# Patient Record
Sex: Female | Born: 1980
Health system: Southern US, Community
[De-identification: ages and names within clinical notes are randomized; demographics above are authoritative.]

## PROBLEM LIST (undated history)

## (undated) ENCOUNTER — Inpatient Hospital Stay (HOSPITAL_COMMUNITY): Payer: Self-pay

## (undated) DIAGNOSIS — Z789 Other specified health status: Secondary | ICD-10-CM

## (undated) HISTORY — PX: NO PAST SURGERIES: SHX2092

---

## 2001-09-29 ENCOUNTER — Inpatient Hospital Stay (HOSPITAL_COMMUNITY): Admission: AD | Admit: 2001-09-29 | Discharge: 2001-09-29 | Payer: Self-pay | Admitting: Obstetrics and Gynecology

## 2001-10-02 ENCOUNTER — Inpatient Hospital Stay (HOSPITAL_COMMUNITY): Admission: AD | Admit: 2001-10-02 | Discharge: 2001-10-02 | Payer: Self-pay | Admitting: Obstetrics and Gynecology

## 2002-10-15 ENCOUNTER — Emergency Department (HOSPITAL_COMMUNITY): Admission: EM | Admit: 2002-10-15 | Discharge: 2002-10-15 | Payer: Self-pay | Admitting: Emergency Medicine

## 2003-01-15 ENCOUNTER — Inpatient Hospital Stay (HOSPITAL_COMMUNITY): Admission: AD | Admit: 2003-01-15 | Discharge: 2003-01-18 | Payer: Self-pay | Admitting: Obstetrics and Gynecology

## 2003-11-05 ENCOUNTER — Inpatient Hospital Stay (HOSPITAL_COMMUNITY): Admission: AD | Admit: 2003-11-05 | Discharge: 2003-11-05 | Payer: Self-pay | Admitting: Obstetrics and Gynecology

## 2004-01-05 ENCOUNTER — Emergency Department (HOSPITAL_COMMUNITY): Admission: EM | Admit: 2004-01-05 | Discharge: 2004-01-05 | Payer: Self-pay | Admitting: Emergency Medicine

## 2004-11-02 ENCOUNTER — Inpatient Hospital Stay (HOSPITAL_COMMUNITY): Admission: AD | Admit: 2004-11-02 | Discharge: 2004-11-02 | Payer: Self-pay | Admitting: Obstetrics and Gynecology

## 2005-01-01 ENCOUNTER — Inpatient Hospital Stay (HOSPITAL_COMMUNITY): Admission: AD | Admit: 2005-01-01 | Discharge: 2005-01-01 | Payer: Self-pay | Admitting: Obstetrics and Gynecology

## 2005-11-03 ENCOUNTER — Emergency Department (HOSPITAL_COMMUNITY): Admission: EM | Admit: 2005-11-03 | Discharge: 2005-11-03 | Payer: Self-pay | Admitting: Emergency Medicine

## 2006-02-08 ENCOUNTER — Inpatient Hospital Stay (HOSPITAL_COMMUNITY): Admission: AD | Admit: 2006-02-08 | Discharge: 2006-02-08 | Payer: Self-pay | Admitting: Obstetrics & Gynecology

## 2006-12-05 ENCOUNTER — Emergency Department (HOSPITAL_COMMUNITY): Admission: EM | Admit: 2006-12-05 | Discharge: 2006-12-05 | Payer: Self-pay | Admitting: Family Medicine

## 2007-07-14 ENCOUNTER — Emergency Department (HOSPITAL_COMMUNITY): Admission: EM | Admit: 2007-07-14 | Discharge: 2007-07-14 | Payer: Self-pay | Admitting: Family Medicine

## 2008-02-26 ENCOUNTER — Emergency Department (HOSPITAL_COMMUNITY): Admission: EM | Admit: 2008-02-26 | Discharge: 2008-02-26 | Payer: Self-pay | Admitting: Family Medicine

## 2009-02-03 ENCOUNTER — Emergency Department (HOSPITAL_COMMUNITY): Admission: EM | Admit: 2009-02-03 | Discharge: 2009-02-03 | Payer: Self-pay | Admitting: Emergency Medicine

## 2009-08-12 ENCOUNTER — Emergency Department (HOSPITAL_COMMUNITY): Admission: EM | Admit: 2009-08-12 | Discharge: 2009-08-12 | Payer: Self-pay | Admitting: Family Medicine

## 2010-05-30 ENCOUNTER — Emergency Department (HOSPITAL_COMMUNITY)
Admission: EM | Admit: 2010-05-30 | Discharge: 2010-05-30 | Payer: Self-pay | Source: Home / Self Care | Admitting: Emergency Medicine

## 2010-08-29 LAB — PREGNANCY, URINE: Preg Test, Ur: NEGATIVE

## 2010-08-29 LAB — URINALYSIS, ROUTINE W REFLEX MICROSCOPIC
Bilirubin Urine: NEGATIVE
Glucose, UA: NEGATIVE mg/dL
Ketones, ur: NEGATIVE mg/dL
Nitrite: NEGATIVE
Urobilinogen, UA: 0.2 mg/dL (ref 0.0–1.0)
pH: 6 (ref 5.0–8.0)

## 2010-09-23 LAB — WET PREP, GENITAL: Clue Cells Wet Prep HPF POC: NONE SEEN

## 2010-09-23 LAB — GC/CHLAMYDIA PROBE AMP, GENITAL: Chlamydia, DNA Probe: NEGATIVE

## 2010-11-03 NOTE — Discharge Summary (Signed)
NAME:  Kathleen Andersen, Kathleen Andersen                        ACCOUNT NO.:  [id number]   MEDICAL RECORD NO.:  [id number]                   PATIENT TYPE:  INP   LOCATION:  9115                                 FACILITY:  [facility]   PHYSICIAN:  Malachi Pro. Ambrose Mantle, M.D.              DATE OF BIRTH:  1980/09/04   DATE OF ADMISSION:  01/15/2003  DATE OF DISCHARGE:  01/18/2003                                 DISCHARGE SUMMARY   HISTORY OF PRESENT ILLNESS:  A 30 year old black single female para 0-0-1-0,  gravida 2, last period April 03, 2002, [facility] January 09, 2003 by dates, but  January 24, 2003 by ultrasound admitted for induction.  Blood group and type  O+.  Negative antibody.  Nonreactive serology.  Rubella immune.  Hepatitis B  surface antigen negative.  HIV negative.  GC and Chlamydia negative.  One-  hour Glucola 105.  Group B Strep negative.  Urine culture E. coli Proteus  mirabilis.  Ultrasound July 08, 2002:  Average gestational age 53.3  weeks, [facility] January 24, 2003.  On August 31, 2002 ultrasound:  Average  gestational age [redacted] weeks 5 days, [facility] January 23, 2003.  Prenatal course was  uncomplicated.  On the day of admission the cervix was 2 cm, 60%.  She was  offered induction.   ALLERGIES:  No known allergies.   PAST SURGICAL HISTORY:  None.   PAST MEDICAL HISTORY:  Usual childhood diseases.  She did have Chlamydia in  October of 2003.   SOCIAL HISTORY:  Alcohol, tobacco, and drugs:  None.   FAMILY HISTORY:  No significant family history in first degree relatives.   PAST OBSTETRICAL HISTORY:  Termination of pregnancy in March of 2003 at 11  weeks.   PHYSICAL EXAMINATION:  VITAL SIGNS:  Normal.  ABDOMEN:  Soft.  Fundal height 38 cm.  Fetal heart tones normal.  Cervix 2  cm, 60%.   HOSPITAL COURSE:  Artificial rupture of the membranes was attempted and was  successful.  The patient entered active labor, received an epidural, and was  progressed to full dilatation at 6:27 a.m.  She pushed well,  brought the  vertex to the perineum, and delivered spontaneously ROA over a bilateral  labial and a right vaginal laceration a living female infant 5 pounds 11  ounces with Apgars of 8 at one and 9 at five minutes.  Malachi Pro. Ambrose Mantle,  M.D. was in attendance.  Placenta intact.  Uterus normal.  Right vaginal  laceration sutured with 2-0 Vicryl for hemostasis.  All other lacerations  were superficial, not bleeding, and were not repaired.  Blood loss about 300  mL.  Postpartum the patient did well and was discharged on the second  postpartum day.   LABORATORY DATA:  Initial hemoglobin 11.2, hematocrit 33.3, white count  8400, platelet count 279,000.  Follow-up hemoglobin 9.6.  RPR nonreactive.   FINAL DIAGNOSES:  Intrauterine pregnancy at 46  weeks, delivered right  occiput anterior.   OPERATION:  1. Spontaneous delivery right occiput anterior.  2. Repair of right vaginal laceration.   FINAL CONDITION:  Improved.   DISCHARGE INSTRUCTIONS:  Regular discharge instruction booklet.  The patient  is given a prescription for ibuprofen 600 mg 20 tablets one q.6h. as needed  for pain and one refill.  She is asked to return to the office in six weeks  for follow-up examination.                                               Malachi Pro. Ambrose Mantle, M.D.    TFH/MEDQ  D:  01/18/2003  T:  01/18/2003  Job:  [id number]

## 2010-12-20 ENCOUNTER — Inpatient Hospital Stay (INDEPENDENT_AMBULATORY_CARE_PROVIDER_SITE_OTHER)
Admission: RE | Admit: 2010-12-20 | Discharge: 2010-12-20 | Disposition: A | Discharge status: Home / Self Care | Payer: Self-pay | Source: Ambulatory Visit | Attending: Emergency Medicine | Admitting: Emergency Medicine

## 2010-12-20 DIAGNOSIS — Z331 Pregnant state, incidental: Secondary | ICD-10-CM

## 2010-12-20 DIAGNOSIS — R6889 Other general symptoms and signs: Secondary | ICD-10-CM

## 2010-12-20 LAB — POCT URINALYSIS DIP (DEVICE)
Glucose, UA: NEGATIVE mg/dL
Hgb urine dipstick: NEGATIVE
Ketones, ur: NEGATIVE mg/dL
Nitrite: NEGATIVE
Protein, ur: NEGATIVE mg/dL
Specific Gravity, Urine: >=1.03 (ref 1.005–1.030)
Urobilinogen, UA: 0.2 mg/dL (ref 0.0–1.0)

## 2010-12-20 LAB — POCT PREGNANCY, URINE: Preg Test, Ur: POSITIVE

## 2011-02-12 ENCOUNTER — Inpatient Hospital Stay (INDEPENDENT_AMBULATORY_CARE_PROVIDER_SITE_OTHER)
Admission: RE | Admit: 2011-02-12 | Discharge: 2011-02-12 | Disposition: A | Discharge status: Home / Self Care | Payer: Self-pay | Source: Ambulatory Visit | Attending: Family Medicine | Admitting: Family Medicine

## 2011-02-12 DIAGNOSIS — K649 Unspecified hemorrhoids: Secondary | ICD-10-CM

## 2011-04-04 LAB — POCT PREGNANCY, URINE
Operator id: 239701
Preg Test, Ur: NEGATIVE

## 2012-12-27 ENCOUNTER — Encounter (HOSPITAL_COMMUNITY): Payer: Self-pay | Admitting: *Deleted

## 2012-12-27 ENCOUNTER — Inpatient Hospital Stay (HOSPITAL_COMMUNITY): Payer: Self-pay

## 2012-12-27 ENCOUNTER — Inpatient Hospital Stay (HOSPITAL_COMMUNITY)
Admission: AD | Admit: 2012-12-27 | Discharge: 2012-12-27 | Disposition: A | Discharge status: Home / Self Care | Payer: Self-pay | Source: Ambulatory Visit | Attending: Family Medicine | Admitting: Family Medicine

## 2012-12-27 DIAGNOSIS — O209 Hemorrhage in early pregnancy, unspecified: Secondary | ICD-10-CM | POA: Insufficient documentation

## 2012-12-27 HISTORY — DX: Other specified health status: Z78.9

## 2012-12-27 LAB — CBC
MCH: 26.7 pg (ref 26.0–34.0)
MCHC: 33.3 g/dL (ref 30.0–36.0)
RBC: 4.46 MIL/uL (ref 3.87–5.11)

## 2012-12-27 LAB — WET PREP, GENITAL
WBC, Wet Prep HPF POC: NONE SEEN
Yeast Wet Prep HPF POC: NONE SEEN

## 2012-12-27 LAB — HCG, QUANTITATIVE, PREGNANCY: hCG, Beta Chain, Quant, S: 12139 m[IU]/mL — ABNORMAL HIGH (ref <5)

## 2012-12-27 NOTE — MAU Provider Note (Signed)
Chart reviewed and agree with management and plan.  

## 2012-12-27 NOTE — MAU Provider Note (Signed)
History     CSN: [id number]  Arrival date and time: 12/27/12 1027   First Provider Initiated Contact with Patient 12/27/12 1055      Chief Complaint  Patient presents with  . Possible Pregnancy  . Vaginal Bleeding   HPI [name] is a 32 y.o. G2P1001 at [redacted]w[redacted]d who presents to MAU today with complaint of vaginal bleeding. The patient states that this morning she passed a large clot. She has had minimal bleeding since then. She denies abdominal pain, N/V/D or constipation, dizziness, weakness, fever, vaginal discharge or UTI symptoms. She had +HPT recently and LMP was 11/01/12. Patient plans to go to [facility] for prenatal care. Patient admits to intercourse yesterday.     OB History   Grav Para Term Preterm Abortions TAB SAB Ect Mult Living   2 1 1       1       Past Medical History  Diagnosis Date  . Medical history non-contributory     Past Surgical History  Procedure Laterality Date  . No past surgeries      History reviewed. No pertinent family history.  History  Substance Use Topics  . Smoking status: Light Tobacco Smoker    Types: Cigarettes  . Smokeless tobacco: Not on file  . Alcohol Use: 4.2 oz/week    7 Glasses of wine per week    Allergies: No Known Allergies  No prescriptions prior to admission    Review of Systems  Constitutional: Negative for fever and malaise/fatigue.  Gastrointestinal: Negative for nausea, vomiting, abdominal pain, diarrhea and constipation.  Genitourinary: Negative for dysuria, urgency and frequency.       + vaginal bleeding Neg - vaginal discharge  Neurological: Negative for dizziness, loss of consciousness and weakness.   Physical Exam   Blood pressure 119/80, pulse 104, temperature 97.9 F (36.6 C), temperature source Axillary, resp. rate 18, last menstrual period 11/01/2012.  Physical Exam  Constitutional: She is oriented to person, place, and time. She appears well-developed and well-nourished. No distress.    HENT:  Head: Normocephalic and atraumatic.  Cardiovascular: Normal rate, regular rhythm and normal heart sounds.   Respiratory: Effort normal and breath sounds normal. No respiratory distress.  GI: Soft. Bowel sounds are normal. She exhibits no distension and no mass. There is no tenderness. There is no rebound and no guarding.  Genitourinary: Uterus is enlarged. Uterus is not tender. Cervix exhibits no motion tenderness, no discharge and no friability. Right adnexum displays no mass and no tenderness. Left adnexum displays no mass and no tenderness. There is bleeding (scant bleeding in the vagina) around the vagina. No vaginal discharge found.  Neurological: She is alert and oriented to person, place, and time.  Skin: Skin is warm and dry. No erythema.  Psychiatric: She has a normal mood and affect.   Results for orders placed during the hospital encounter of 12/27/12 (from the past 24 hour(s))  POCT PREGNANCY, URINE     Status: Abnormal   Collection Time    12/27/12 10:40 AM      Result Value Range   Preg Test, Ur POSITIVE (*) NEGATIVE  WET PREP, GENITAL     Status: Abnormal   Collection Time    12/27/12 11:05 AM      Result Value Range   Yeast Wet Prep HPF POC NONE SEEN  NONE SEEN   Trich, Wet Prep NONE SEEN  NONE SEEN   Clue Cells Wet Prep HPF POC FEW (*) NONE SEEN  WBC, Wet Prep HPF POC NONE SEEN  NONE SEEN  CBC     Status: Abnormal   Collection Time    12/27/12 11:12 AM      Result Value Range   WBC 4.8  4.0 - 10.5 K/uL   RBC 4.46  3.87 - 5.11 MIL/uL   Hemoglobin 11.9 (*) 12.0 - 15.0 g/dL   HCT 16.1 (*) 09.6 - 04.5 %   MCV 80.0  78.0 - 100.0 fL   MCH 26.7  26.0 - 34.0 pg   MCHC 33.3  30.0 - 36.0 g/dL   RDW 40.9  81.1 - 91.4 %   Platelets 287  150 - 400 K/uL  HCG, QUANTITATIVE, PREGNANCY     Status: Abnormal   Collection Time    12/27/12 11:12 AM      Result Value Range   hCG, Beta Chain, Quant, S 12139 (*) <5 mIU/mL  ABO/RH     Status: None   Collection Time     12/27/12 11:12 AM      Result Value Range   ABO/RH(D) O POS      MAU Course  Procedures None  MDM +UPT UA, Wet prep, GC/Chlamydia, CBC, ABO/Rh, quant hCG and [place] today  Assessment and Plan  A: IUGS and YS at [redacted]w[redacted]d without cardiac activity Vaginal bleeding in early pregnancy, first trimester  P: Discharge home First trimester warning signs reviewed Patient given pregnancy confirmation letter Patient plans to start prenatal care with [facility] Patient may return to MAU as needed or if her condition were to change or worsen  Freddi Starr, PA-C  12/27/2012, 1:27 PM

## 2012-12-27 NOTE — MAU Note (Signed)
Pt reports she started having some cramping since last night. Stated a real big blood clot came out this morning. Had positive pregnancy test at home. no period for 2 months

## 2012-12-28 LAB — GC/CHLAMYDIA PROBE AMP: CT Probe RNA: NEGATIVE

## 2013-11-01 ENCOUNTER — Encounter (HOSPITAL_COMMUNITY): Payer: Self-pay | Admitting: *Deleted

## 2014-04-19 ENCOUNTER — Encounter (HOSPITAL_COMMUNITY): Payer: Self-pay | Admitting: *Deleted

## 2014-05-17 ENCOUNTER — Emergency Department (INDEPENDENT_AMBULATORY_CARE_PROVIDER_SITE_OTHER)
Admission: EM | Admit: 2014-05-17 | Discharge: 2014-05-17 | Disposition: A | Discharge status: Home / Self Care | Payer: Self-pay | Source: Home / Self Care | Attending: Family Medicine | Admitting: Family Medicine

## 2014-05-17 ENCOUNTER — Encounter (HOSPITAL_COMMUNITY): Payer: Self-pay | Admitting: *Deleted

## 2014-05-17 DIAGNOSIS — K0889 Other specified disorders of teeth and supporting structures: Secondary | ICD-10-CM

## 2014-05-17 DIAGNOSIS — K088 Other specified disorders of teeth and supporting structures: Secondary | ICD-10-CM

## 2014-05-17 MED ORDER — AMOXICILLIN 500 MG PO CAPS: 500.0000 mg | ORAL_CAPSULE | Freq: Three times a day (TID) | ORAL | Status: DC

## 2014-05-17 NOTE — ED Provider Notes (Signed)
CSN: [id number]     Arrival date & time 05/17/14  1529 History   First MD Initiated Contact with Patient 05/17/14 1607     Chief Complaint  Patient presents with  . Dental Pain   (Consider location/radiation/quality/duration/timing/severity/associated sxs/prior Treatment) HPI Comments: 33 year old female complaining of acute onset of right upper dental and jaw pain. Proximally one week ago she had a right upper third molar extracted and the adjacent second molar was preserved with filling. She had very little pain post extraction and for the following several days. Last March she developed acute pain to the right upper jaw. She describes it as severe. She had been taking ibuprofen which worked well for her previous pain but not this episode. She is concerned that there may be an infection. She has an appointment with her dentist in 2 days.   Past Medical History  Diagnosis Date  . Medical history non-contributory    Past Surgical History  Procedure Laterality Date  . No past surgeries     History reviewed. No pertinent family history. History  Substance Use Topics  . Smoking status: Former Smoker    Types: Cigarettes  . Smokeless tobacco: Not on file  . Alcohol Use: No   OB History    Gravida Para Term Preterm AB TAB SAB Ectopic Multiple Living   2 1 1       1      Review of Systems  Constitutional: Negative for fever.  HENT: Positive for dental problem.   All other systems reviewed and are negative.   Allergies  Review of patient's allergies indicates no known allergies.  Home Medications   Prior to Admission medications   Medication Sig Start Date End Date Taking? Authorizing Provider  amoxicillin (AMOXIL) 500 MG capsule Take 1 capsule (500 mg total) by mouth 3 (three) times daily. 05/17/14   Hayden Rasmussenavid Jelan Batterton, NP   BP 110/89 mmHg  Pulse 63  Temp(Src) 98 F (36.7 C) (Oral)  Resp 16  SpO2 98% Physical Exam  Constitutional: She is oriented to person, place, and time.  She appears well-developed and well-nourished. No distress.  HENT:  Mouth/Throat: Oropharynx is clear and moist. No oropharyngeal exudate.  The examiner had quite a difficult time in locating surgical marks for the extraction. There was clearly have evidence of recent dental filling of the second molar. There is tenderness just posterior to the second molar where there is a hard structure felt to represent the third molar. There is no bleeding or drainage. No erythema. No evidence of abscess. No facial swelling.  Neck: Normal range of motion. Neck supple.  Neurological: She is alert and oriented to person, place, and time.  Skin: Skin is warm and dry.  Psychiatric: She has a normal mood and affect.  Nursing note and vitals reviewed.   ED Course  Procedures (including critical care time) Labs Review Labs Reviewed - No data to display  Imaging Review No results found.   MDM   1. Pain, dental    Patient is not requesting analgesics. Will empirically treat for possible early occult infection with amoxicillin. Follow-up with dentist as scheduled in 2 days.   Try ice packs.      Hayden Rasmussenavid Brendin Situ, NP 05/17/14 [phone]

## 2014-05-17 NOTE — Discharge Instructions (Signed)

## 2014-05-17 NOTE — ED Notes (Signed)
Pt  Reports  Pain  r  Upper  Gum line    X 2   Days    No swelling        Sitting upright on  Exam table speaking in  Complete  sentances

## 2014-12-09 ENCOUNTER — Encounter (HOSPITAL_COMMUNITY): Payer: Self-pay

## 2014-12-09 ENCOUNTER — Inpatient Hospital Stay (HOSPITAL_COMMUNITY)
Admission: AD | Admit: 2014-12-09 | Discharge: 2014-12-09 | Disposition: A | Discharge status: Home / Self Care | Payer: No Typology Code available for payment source | Source: Ambulatory Visit | Attending: Family Medicine | Admitting: Family Medicine

## 2014-12-09 DIAGNOSIS — A599 Trichomoniasis, unspecified: Secondary | ICD-10-CM | POA: Diagnosis not present

## 2014-12-09 DIAGNOSIS — Z87891 Personal history of nicotine dependence: Secondary | ICD-10-CM | POA: Diagnosis not present

## 2014-12-09 DIAGNOSIS — Z202 Contact with and (suspected) exposure to infections with a predominantly sexual mode of transmission: Secondary | ICD-10-CM | POA: Diagnosis present

## 2014-12-09 LAB — URINALYSIS, ROUTINE W REFLEX MICROSCOPIC
Bilirubin Urine: NEGATIVE
Glucose, UA: NEGATIVE mg/dL
Hgb urine dipstick: NEGATIVE
Ketones, ur: NEGATIVE mg/dL
Leukocytes, UA: NEGATIVE
Nitrite: NEGATIVE
Protein, ur: NEGATIVE mg/dL
Specific Gravity, Urine: 1.02 (ref 1.005–1.030)
Urobilinogen, UA: 0.2 mg/dL (ref 0.0–1.0)
pH: 6 (ref 5.0–8.0)

## 2014-12-09 LAB — WET PREP, GENITAL
Clue Cells Wet Prep HPF POC: NONE SEEN
WBC, Wet Prep HPF POC: NONE SEEN
Yeast Wet Prep HPF POC: NONE SEEN

## 2014-12-09 LAB — POCT PREGNANCY, URINE: PREG TEST UR: NEGATIVE

## 2014-12-09 MED ORDER — PROMETHAZINE HCL 25 MG PO TABS: 25.0000 mg | ORAL_TABLET | Freq: Once | ORAL | Status: DC

## 2014-12-09 MED ORDER — METRONIDAZOLE 500 MG PO TABS
2000.0000 mg | ORAL_TABLET | Freq: Once | ORAL | Status: AC
  Administered 2014-12-09: 2000 mg via ORAL
  Filled 2014-12-09: qty 4

## 2014-12-09 NOTE — MAU Provider Note (Signed)
History     CSN: [id number]  Arrival date and time: 12/09/14 2104   First Provider Initiated Contact with Patient 12/09/14 2222      Chief Complaint  Patient presents with  . Exposure to STD   Exposure to STD  Primary symptoms comment: boyfriend has trich. This is a new problem. The vaginal discharge was normal. Pertinent negatives include no fever. She has tried nothing for the symptoms.    34 y.o. G2P1001 presents to the MAU stating that her boyfriend was diagnosed with trich and she was told to go to the health dept. Denies vaginal discharge or vaginal symptoms.  Past Medical History  Diagnosis Date  . Medical history non-contributory     Past Surgical History  Procedure Laterality Date  . No past surgeries      No family history on file.  History  Substance Use Topics  . Smoking status: Former Smoker    Types: Cigarettes  . Smokeless tobacco: Not on file  . Alcohol Use: No    Allergies:  Allergies  Allergen Reactions  . Metronidazole Nausea And Vomiting    Prescriptions prior to admission  Medication Sig Dispense Refill Last Dose  . Pediatric Multiple Vit-C-FA (FLINSTONES GUMMIES OMEGA-3 DHA) CHEW Chew 2 tablets by mouth daily.   12/09/2014 at Unknown time    Review of Systems  Constitutional: Negative for fever.  All other systems reviewed and are negative.  Physical Exam   Blood pressure 139/95, pulse 97, temperature 98.3 F (36.8 C), temperature source Oral, resp. rate 18, height  (1.626 m), weight 91.627 kg (202 lb), last menstrual period 11/15/2014, SpO2 100 %, not currently breastfeeding. Results for orders placed or performed during the hospital encounter of 12/09/14 (from the past 24 hour(s))  Urinalysis, Routine w reflex microscopic (not at [facility])     Status: None   Collection Time: 12/09/14  9:20 PM  Result Value Ref Range   Color, Urine YELLOW YELLOW   APPearance CLEAR CLEAR   Specific Gravity, Urine 1.020 1.005 - 1.030   pH 6.0 5.0  - 8.0   Glucose, UA NEGATIVE NEGATIVE mg/dL   Hgb urine dipstick NEGATIVE NEGATIVE   Bilirubin Urine NEGATIVE NEGATIVE   Ketones, ur NEGATIVE NEGATIVE mg/dL   Protein, ur NEGATIVE NEGATIVE mg/dL   Urobilinogen, UA 0.2 0.0 - 1.0 mg/dL   Nitrite NEGATIVE NEGATIVE   Leukocytes, UA NEGATIVE NEGATIVE  Pregnancy, urine POC     Status: None   Collection Time: 12/09/14  9:34 PM  Result Value Ref Range   Preg Test, Ur NEGATIVE NEGATIVE  Wet prep, genital     Status: Abnormal   Collection Time: 12/09/14 10:30 PM  Result Value Ref Range   Yeast Wet Prep HPF POC NONE SEEN NONE SEEN   Trich, Wet Prep FEW (A) NONE SEEN   Clue Cells Wet Prep HPF POC NONE SEEN NONE SEEN   WBC, Wet Prep HPF POC NONE SEEN NONE SEEN   Physical Exam  Nursing note and vitals reviewed. Constitutional: She is oriented to person, place, and time. She appears well-developed and well-nourished. No distress.  Neck: Normal range of motion.  Cardiovascular: Normal rate.   Respiratory: Effort normal. No respiratory distress.  GI: Soft.  Genitourinary: Vagina normal and uterus normal. No vaginal discharge found.  Musculoskeletal: Normal range of motion.  Neurological: She is alert and oriented to person, place, and time.  Skin: Skin is warm and dry.  Psychiatric: She has a normal mood and affect.  Her behavior is normal. Judgment and thought content normal.    MAU Course  Procedures  MDM Spec exam normal; wet prep , gc /ch collected  Assessment and Plan  Trichomoniasis  Flagyl 2gms Phenergan 25 mg po  Discharge to home  [facility] [place] 12/09/2014, 10:29 PM

## 2014-12-09 NOTE — MAU Note (Signed)
Patient was told by partner that she need to be treated for trich. Pt has vaginal odor, but hasn't noticed a discharge. Some lower abdominal pain but thinks it's b/c she's due to come on her period next week.

## 2014-12-09 NOTE — Discharge Instructions (Signed)

## 2014-12-10 LAB — GC/CHLAMYDIA PROBE AMP (~~LOC~~) NOT AT ARMC
Chlamydia: NEGATIVE
Neisseria Gonorrhea: NEGATIVE

## 2017-10-03 ENCOUNTER — Encounter (HOSPITAL_COMMUNITY): Payer: Self-pay | Admitting: Emergency Medicine

## 2017-10-03 ENCOUNTER — Ambulatory Visit (HOSPITAL_COMMUNITY)
Admission: EM | Admit: 2017-10-03 | Discharge: 2017-10-03 | Disposition: A | Discharge status: Home / Self Care | Payer: 59 | Attending: Family Medicine | Admitting: Family Medicine

## 2017-10-03 ENCOUNTER — Other Ambulatory Visit: Payer: Self-pay

## 2017-10-03 DIAGNOSIS — J209 Acute bronchitis, unspecified: Secondary | ICD-10-CM | POA: Diagnosis present

## 2017-10-03 NOTE — ED Triage Notes (Signed)
Patient has a cough and chest congestion since Sunday.  All week has not felt well.  Today noticed white in back of throat, but does not have a sore throat.

## 2017-10-03 NOTE — ED Provider Notes (Signed)
[facility]    CSN: [id number] Arrival date & time: 10/03/17  1945     History   Chief Complaint Chief Complaint  Patient presents with  . Cough    HPI Kathleen Andersen is a 37 y.o. female.   Patient has had a dry nonproductive cough this week.  She attributes most of that to pollen and she has been taking Claritin and Mucinex.  She did however look in her throat today and saw some white spots on the tonsils which caused her concern and really seemed to prompt revisit tonight.  She denies sore throat  HPI  Past Medical History:  Diagnosis Date  . Medical history non-contributory     Patient Active Problem List   Diagnosis Date Noted  . Acute bronchitis 10/03/2017    Past Surgical History:  Procedure Laterality Date  . NO PAST SURGERIES      OB History    Gravida  1   Para  1   Term  1   Preterm      AB      Living  1     SAB      TAB      Ectopic      Multiple      Live Births               Home Medications    Prior to Admission medications   Not on File    Family History Family History  Problem Relation Age of Onset  . Bronchitis Mother     Social History Social History   Tobacco Use  . Smoking status: Former Smoker    Types: Cigarettes  Substance Use Topics  . Alcohol use: Yes    Alcohol/week: 4.2 oz    Types: 7 Glasses of wine per week  . Drug use: No     Allergies   Metronidazole   Review of Systems Review of Systems  Constitutional: Negative.   HENT: Negative.   Respiratory: Positive for cough.      Physical Exam Triage Vital Signs ED Triage Vitals  Enc Vitals Group     BP 10/03/17 2038 120/78     Pulse Rate 10/03/17 2038 (!) 57     Resp 10/03/17 2038 18     Temp 10/03/17 2038 98.1 F (36.7 C)     Temp Source 10/03/17 2038 Oral     SpO2 10/03/17 2038 100 %     Weight --      Height --      Head Circumference --      Peak Flow --      Pain Score 10/03/17 2035 0     Pain Loc --    Pain Edu? --      Excl. in GC? --    No data found.  Updated Vital Signs BP 120/78 (BP Location: Left Arm)   Pulse (!) 57   Temp 98.1 F (36.7 C) (Oral)   Resp 18   LMP 10/03/2017   SpO2 100%   Visual Acuity Right Eye Distance:   Left Eye Distance:   Bilateral Distance:    Right Eye Near:   Left Eye Near:    Bilateral Near:     Physical Exam  HENT:  Head: Normocephalic and atraumatic.  Mouth/Throat: No oropharyngeal exudate.  Cardiovascular: Normal rate and regular rhythm.  Pulmonary/Chest: Effort normal and breath sounds normal.     UC Treatments / Results  Labs (all labs  ordered are listed, but only abnormal results are displayed) Labs Reviewed - No data to display  EKG None Radiology No results found.  Procedures Procedures (including critical care time)  Medications Ordered in UC Medications - No data to display   Initial Impression / Assessment and Plan / UC Course  I have reviewed the triage vital signs and the nursing notes.  Pertinent labs & imaging results that were available during my care of the patient were reviewed by me and considered in my medical decision making (see chart for details).     Bronchitis, probable allergic in nature.  Reassured.  Continue Claritin and Mucinex  Final Clinical Impressions(s) / UC Diagnoses   Final diagnoses:  Acute bronchitis, unspecified organism    ED Discharge Orders    None       Controlled Substance Prescriptions [place] Controlled Substance Registry consulted? No   Frederica KusterMiller, Stephen M, MD 10/03/17 2057

## 2017-10-25 DIAGNOSIS — Z733 Stress, not elsewhere classified: Secondary | ICD-10-CM | POA: Diagnosis not present

## 2017-10-25 DIAGNOSIS — G479 Sleep disorder, unspecified: Secondary | ICD-10-CM | POA: Diagnosis not present

## 2019-01-28 ENCOUNTER — Ambulatory Visit: Payer: Self-pay | Admitting: Allergy & Immunology

## 2019-03-24 ENCOUNTER — Other Ambulatory Visit: Payer: Self-pay

## 2019-03-24 DIAGNOSIS — Z20822 Contact with and (suspected) exposure to covid-19: Secondary | ICD-10-CM

## 2019-03-26 LAB — NOVEL CORONAVIRUS, NAA: SARS-CoV-2, NAA: NOT DETECTED

## 2019-04-08 ENCOUNTER — Other Ambulatory Visit: Payer: Self-pay

## 2019-04-08 DIAGNOSIS — Z20822 Contact with and (suspected) exposure to covid-19: Secondary | ICD-10-CM

## 2019-04-09 LAB — NOVEL CORONAVIRUS, NAA: SARS-CoV-2, NAA: NOT DETECTED

## 2019-05-20 ENCOUNTER — Other Ambulatory Visit: Payer: Self-pay

## 2019-05-20 DIAGNOSIS — Z20822 Contact with and (suspected) exposure to covid-19: Secondary | ICD-10-CM

## 2019-05-22 LAB — NOVEL CORONAVIRUS, NAA: SARS-CoV-2, NAA: NOT DETECTED

## 2019-06-19 NOTE — L&D Delivery Note (Signed)
Faculty Note  Pt seen for placement of 400 mcg of cytotec, second dose for inevitable second trimester miscarriage and chorioamnionitis.  Vaginal exam revealed fetal parts in the vagina with cord and footling breech presentation.  Pt pushed 2-3 times and delivered the nonviable fetus in its entirety. Bleeding was minimal.  The vagina was rechecked and the placenta was found partially expelled through the cervix.  With gentle traction, the pt was asked to push.  After 3-4 pushes the placenta was successfully delivered.    Bleeding was acceptable, but pitocin was started nonetheless.  No obvious vaginal or perineal lacerations were noted.  Pt will remain on unasyn until she is afebrile 24-48 hours. Placenta and fetus will be sent to pathology.   Mariel Aloe, MD Faculty Attending, [facility] [employer].

## 2019-07-20 ENCOUNTER — Ambulatory Visit: Payer: 59 | Attending: Internal Medicine

## 2020-05-31 ENCOUNTER — Emergency Department (HOSPITAL_COMMUNITY)
Admission: EM | Admit: 2020-05-31 | Discharge: 2020-05-31 | Disposition: A | Discharge status: Home / Self Care | Payer: 59 | Attending: Emergency Medicine | Admitting: Emergency Medicine

## 2020-05-31 ENCOUNTER — Encounter (HOSPITAL_COMMUNITY): Payer: Self-pay

## 2020-05-31 DIAGNOSIS — Z87891 Personal history of nicotine dependence: Secondary | ICD-10-CM | POA: Diagnosis not present

## 2020-05-31 DIAGNOSIS — Z3A10 10 weeks gestation of pregnancy: Secondary | ICD-10-CM | POA: Insufficient documentation

## 2020-05-31 DIAGNOSIS — R1084 Generalized abdominal pain: Secondary | ICD-10-CM | POA: Diagnosis not present

## 2020-05-31 DIAGNOSIS — Z8616 Personal history of COVID-19: Secondary | ICD-10-CM | POA: Diagnosis not present

## 2020-05-31 DIAGNOSIS — R3589 Other polyuria: Secondary | ICD-10-CM | POA: Diagnosis not present

## 2020-05-31 DIAGNOSIS — O26891 Other specified pregnancy related conditions, first trimester: Secondary | ICD-10-CM

## 2020-05-31 DIAGNOSIS — Z3201 Encounter for pregnancy test, result positive: Secondary | ICD-10-CM | POA: Insufficient documentation

## 2020-05-31 LAB — COMPREHENSIVE METABOLIC PANEL
ALT: 19 U/L (ref 0–44)
AST: 21 U/L (ref 15–41)
Albumin: 3 g/dL — ABNORMAL LOW (ref 3.5–5.0)
Alkaline Phosphatase: 49 U/L (ref 38–126)
Anion gap: 7 (ref 5–15)
BUN: <5 mg/dL — ABNORMAL LOW (ref 6–20)
CO2: 22 mmol/L (ref 22–32)
Calcium: 8.5 mg/dL — ABNORMAL LOW (ref 8.9–10.3)
Chloride: 106 mmol/L (ref 98–111)
Creatinine, Ser: 0.62 mg/dL (ref 0.44–1.00)
GFR, Estimated: >60 mL/min (ref >60)
Glucose, Bld: 94 mg/dL (ref 70–99)
Potassium: 3.6 mmol/L (ref 3.5–5.1)
Sodium: 135 mmol/L (ref 135–145)
Total Bilirubin: 0.4 mg/dL (ref 0.3–1.2)
Total Protein: 6.1 g/dL — ABNORMAL LOW (ref 6.5–8.1)

## 2020-05-31 LAB — CBC
HCT: 34.4 % — ABNORMAL LOW (ref 36.0–46.0)
Hemoglobin: 11.6 g/dL — ABNORMAL LOW (ref 12.0–15.0)
MCH: 28.5 pg (ref 26.0–34.0)
MCHC: 33.7 g/dL (ref 30.0–36.0)
MCV: 84.5 fL (ref 80.0–100.0)
Platelets: 247 10*3/uL (ref 150–400)
RBC: 4.07 MIL/uL (ref 3.87–5.11)
RDW: 14.1 % (ref 11.5–15.5)
WBC: 5.3 10*3/uL (ref 4.0–10.5)
nRBC: 0 % (ref 0.0–0.2)

## 2020-05-31 LAB — URINALYSIS, ROUTINE W REFLEX MICROSCOPIC
Bilirubin Urine: NEGATIVE
Glucose, UA: NEGATIVE mg/dL
Hgb urine dipstick: NEGATIVE
Ketones, ur: NEGATIVE mg/dL
Leukocytes,Ua: NEGATIVE
Nitrite: NEGATIVE
Protein, ur: 30 mg/dL — AB
Specific Gravity, Urine: 1.021 (ref 1.005–1.030)
pH: 6 (ref 5.0–8.0)

## 2020-05-31 LAB — I-STAT BETA HCG BLOOD, ED (MC, WL, AP ONLY): I-stat hCG, quantitative: >2000 m[IU]/mL — ABNORMAL HIGH (ref <5)

## 2020-05-31 LAB — HCG, QUANTITATIVE, PREGNANCY: hCG, Beta Chain, Quant, S: 34569 m[IU]/mL — ABNORMAL HIGH (ref <5)

## 2020-05-31 LAB — LIPASE, BLOOD: Lipase: 20 U/L (ref 11–51)

## 2020-05-31 NOTE — ED Triage Notes (Signed)
Pt presents with c/o abdominal pain for over 2 months. Pt reports she did have Covid in February and her senses are just now coming to come back. Pt reports because her smell is still foul, she has been unable to eat meat and has been living on mainly dairy. Pt does report an increased urination over the past week and last night she did have an incontinent episode.

## 2020-05-31 NOTE — Discharge Instructions (Addendum)
As discussed, with your new pregnancy it is very important that you follow-up with your obstetrician.  Return here for concerning changes in your condition.

## 2020-05-31 NOTE — ED Provider Notes (Signed)
[facility]-EMERGENCY DEPT Provider Note   CSN: [id number] Arrival date & time: 05/31/20  8756     History Chief Complaint  Patient presents with  . Abdominal Pain    Kathleen Andersen is a 39 y.o. female.  HPI Patient presents with 2 months of abdominal discomfort with palpation, polyuria, and urge incontinence. Last menstrual period was 3 months ago patient has one 101 year old child. She had Covid earlier in the year, and notes that she had a prolonged recovery including very gradual recovery of loss of taste and smell.  With increasing polyuria, urge incontinence she presents for evaluation.  She has abdominal pain largely with palpation, minimally at rest.  There is associated anorexia, mild nausea, but no vomiting.     Past Medical History:  Diagnosis Date  . Medical history non-contributory     Patient Active Problem List   Diagnosis Date Noted  . Acute bronchitis 10/03/2017    Past Surgical History:  Procedure Laterality Date  . NO PAST SURGERIES       OB History    Gravida  1   Para  1   Term  1   Preterm      AB      Living  1     SAB      IAB      Ectopic      Multiple      Live Births              Family History  Problem Relation Age of Onset  . Bronchitis Mother     Social History   Tobacco Use  . Smoking status: Former Smoker    Types: Cigarettes  Substance Use Topics  . Alcohol use: Yes    Alcohol/week: 7.0 standard drinks    Types: 7 Glasses of wine per week  . Drug use: No    Home Medications Prior to Admission medications   Not on File    Allergies    Metronidazole  Review of Systems   Review of Systems  Constitutional:       Per HPI, otherwise negative  HENT:       Per HPI, otherwise negative  Respiratory:       Per HPI, otherwise negative  Cardiovascular:       Per HPI, otherwise negative  Gastrointestinal: Negative for vomiting.  Endocrine:       Negative aside from HPI   Genitourinary:       Neg aside from HPI   Musculoskeletal:       Per HPI, otherwise negative  Skin: Negative.   Neurological: Negative for syncope.    Physical Exam Updated Vital Signs BP 110/80 (BP Location: Right Arm)   Pulse 65   Temp 98.3 F (36.8 C) (Oral)   Resp 15   LMP 04/01/2020 (Approximate)   SpO2 100%   Physical Exam Vitals and nursing note reviewed.  Constitutional:      General: She is not in acute distress.    Appearance: She is well-developed and well-nourished.  HENT:     Head: Normocephalic and atraumatic.  Eyes:     Extraocular Movements: EOM normal.     Conjunctiva/sclera: Conjunctivae normal.  Cardiovascular:     Rate and Rhythm: Normal rate and regular rhythm.  Pulmonary:     Effort: Pulmonary effort is normal. No respiratory distress.     Breath sounds: Normal breath sounds. No stridor.  Abdominal:     General: There  is no distension.     Tenderness: There is no abdominal tenderness. There is no guarding.  Musculoskeletal:        General: No edema.  Skin:    General: Skin is warm and dry.  Neurological:     Mental Status: She is alert and oriented to person, place, and time.     Cranial Nerves: No cranial nerve deficit.  Psychiatric:        Mood and Affect: Mood and affect normal.     ED Results / Procedures / Treatments   Labs (all labs ordered are listed, but only abnormal results are displayed) Labs Reviewed  COMPREHENSIVE METABOLIC PANEL - Abnormal; Notable for the following components:      Result Value   BUN <5 (*)    Calcium 8.5 (*)    Total Protein 6.1 (*)    Albumin 3.0 (*)    All other components within normal limits  CBC - Abnormal; Notable for the following components:   Hemoglobin 11.6 (*)    HCT 34.4 (*)    All other components within normal limits  URINALYSIS, ROUTINE W REFLEX MICROSCOPIC - Abnormal; Notable for the following components:   APPearance HAZY (*)    Protein, ur 30 (*)    Bacteria, UA RARE (*)     All other components within normal limits  HCG, QUANTITATIVE, PREGNANCY - Abnormal; Notable for the following components:   hCG, Beta Chain, Quant, S 34,569 (*)    All other components within normal limits  I-STAT BETA HCG BLOOD, ED ([facility], [facility], [facility] ONLY) - Abnormal; Notable for the following components:   I-stat hCG, quantitative >2,000.0 (*)    All other components within normal limits  LIPASE, BLOOD    Radiology EMERGENCY DEPARTMENT [place] PREGNANCY "Study: Limited Ultrasound of the Pelvis for Pregnancy"  INDICATIONS:Pregnancy(required) and Abdominal or pelvic pain Multiple views of the uterus and pelvic cavity were obtained in real-time with a multi-frequency probe.  APPROACH:Transabdominal  PERFORMED BY: Myself IMAGES ARCHIVED?: Yes LIMITATIONS: none PREGNANCY FREE FLUID: None ADNEXAL FINDINGS:Left ovary not seen R ovary not seen GESTATIONAL AGE, ESTIMATE: 10 weeks FETAL HEART RATE: 150 INTERPRETATION: Intrauterine gestational sac noted and Fetal heart activity seen    Procedures Procedures (including critical care time)  ED Course  I have reviewed the triage vital signs and the nursing notes.  Pertinent labs & imaging results that were available during my care of the patient were reviewed by me and considered in my medical decision making (see chart for details).  Adult female, G G2, P1, with a with unknown pregnancy status presents with ongoing mild abdominal discomfort, and greater concern for polyuria, urge incontinence. Here patient is awake, alert, afebrile, hemodynamically unremarkable, but point-of-care labs suggest pregnancy, and serum hCG level is consistent with this. Based on dates, size of fetus on ultrasound, patient is approximately [redacted] weeks along in pregnancy. Patient has no other complaints, has generally reassuring other labs, urinalysis. Patient discharged in stable condition to follow-up with her obstetrician. Final Clinical Impression(s) / ED  Diagnoses Final diagnoses:  Polyuria  Abdominal pain during pregnancy in first trimester     Gerhard Munch, MD 05/31/20 1951

## 2020-06-03 ENCOUNTER — Other Ambulatory Visit: Payer: Self-pay

## 2020-06-03 ENCOUNTER — Observation Stay (HOSPITAL_COMMUNITY)
Admission: AD | Admit: 2020-06-03 | Discharge: 2020-06-04 | Disposition: A | Discharge status: Home / Self Care | Payer: 59 | Attending: Obstetrics & Gynecology | Admitting: Obstetrics & Gynecology

## 2020-06-03 ENCOUNTER — Inpatient Hospital Stay (HOSPITAL_COMMUNITY): Payer: 59

## 2020-06-03 ENCOUNTER — Encounter (HOSPITAL_COMMUNITY): Payer: Self-pay | Admitting: Obstetrics & Gynecology

## 2020-06-03 DIAGNOSIS — N898 Other specified noninflammatory disorders of vagina: Secondary | ICD-10-CM

## 2020-06-03 DIAGNOSIS — O411220 Chorioamnionitis, second trimester, not applicable or unspecified: Secondary | ICD-10-CM | POA: Diagnosis not present

## 2020-06-03 DIAGNOSIS — O42112 Preterm premature rupture of membranes, onset of labor more than 24 hours following rupture, second trimester: Secondary | ICD-10-CM | POA: Insufficient documentation

## 2020-06-03 DIAGNOSIS — O4690 Antepartum hemorrhage, unspecified, unspecified trimester: Secondary | ICD-10-CM

## 2020-06-03 DIAGNOSIS — R109 Unspecified abdominal pain: Secondary | ICD-10-CM

## 2020-06-03 DIAGNOSIS — Z3A18 18 weeks gestation of pregnancy: Secondary | ICD-10-CM

## 2020-06-03 DIAGNOSIS — Z3A10 10 weeks gestation of pregnancy: Secondary | ICD-10-CM

## 2020-06-03 DIAGNOSIS — Z20822 Contact with and (suspected) exposure to covid-19: Secondary | ICD-10-CM | POA: Diagnosis not present

## 2020-06-03 DIAGNOSIS — O034 Incomplete spontaneous abortion without complication: Secondary | ICD-10-CM

## 2020-06-03 DIAGNOSIS — D72829 Elevated white blood cell count, unspecified: Secondary | ICD-10-CM

## 2020-06-03 DIAGNOSIS — O039 Complete or unspecified spontaneous abortion without complication: Principal | ICD-10-CM | POA: Insufficient documentation

## 2020-06-03 DIAGNOSIS — O98919 Unspecified maternal infectious and parasitic disease complicating pregnancy, unspecified trimester: Secondary | ICD-10-CM | POA: Diagnosis present

## 2020-06-03 DIAGNOSIS — O42919 Preterm premature rupture of membranes, unspecified as to length of time between rupture and onset of labor, unspecified trimester: Secondary | ICD-10-CM | POA: Diagnosis present

## 2020-06-03 LAB — RESP PANEL BY RT-PCR (FLU A&B, COVID) ARPGX2
Influenza A by PCR: NEGATIVE
Influenza B by PCR: NEGATIVE
SARS Coronavirus 2 by RT PCR: NEGATIVE

## 2020-06-03 LAB — CBC
HCT: 34 % — ABNORMAL LOW (ref 36.0–46.0)
Hemoglobin: 12 g/dL (ref 12.0–15.0)
MCH: 29.2 pg (ref 26.0–34.0)
MCHC: 35.3 g/dL (ref 30.0–36.0)
MCV: 82.7 fL (ref 80.0–100.0)
Platelets: 250 10*3/uL (ref 150–400)
RBC: 4.11 MIL/uL (ref 3.87–5.11)
RDW: 13.9 % (ref 11.5–15.5)
WBC: 15.7 10*3/uL — ABNORMAL HIGH (ref 4.0–10.5)
nRBC: 0 % (ref 0.0–0.2)

## 2020-06-03 LAB — WET PREP, GENITAL
Clue Cells Wet Prep HPF POC: NONE SEEN
Sperm: NONE SEEN
Trich, Wet Prep: NONE SEEN
Yeast Wet Prep HPF POC: NONE SEEN

## 2020-06-03 LAB — TYPE AND SCREEN
ABO/RH(D): O POS
Antibody Screen: NEGATIVE

## 2020-06-03 MED ORDER — MISOPROSTOL 200 MCG PO TABS
ORAL_TABLET | ORAL | Status: AC
  Filled 2020-06-03: qty 2

## 2020-06-03 MED ORDER — HYDROMORPHONE HCL 1 MG/ML IJ SOLN
0.5000 mg | Freq: Once | INTRAMUSCULAR | Status: AC
  Administered 2020-06-03: 0.5 mg via INTRAVENOUS
  Filled 2020-06-03: qty 1

## 2020-06-03 MED ORDER — SENNOSIDES-DOCUSATE SODIUM 8.6-50 MG PO TABS
2.0000 | ORAL_TABLET | Freq: Every day | ORAL | Status: DC
  Administered 2020-06-04: 09:00:00 2 via ORAL
  Filled 2020-06-03: qty 2

## 2020-06-03 MED ORDER — ONDANSETRON HCL 4 MG/2ML IJ SOLN: 4.0000 mg | INTRAMUSCULAR | Status: DC | PRN

## 2020-06-03 MED ORDER — LACTATED RINGERS IV SOLN
INTRAVENOUS | Status: DC
Start: 2020-06-03 — End: 2020-06-03

## 2020-06-03 MED ORDER — ZOLPIDEM TARTRATE 5 MG PO TABS: 5.0000 mg | ORAL_TABLET | Freq: Every evening | ORAL | Status: DC | PRN

## 2020-06-03 MED ORDER — ACETAMINOPHEN 500 MG PO TABS: 1000.0000 mg | ORAL_TABLET | Freq: Once | ORAL | Status: DC

## 2020-06-03 MED ORDER — ONDANSETRON HCL 4 MG PO TABS: 4.0000 mg | ORAL_TABLET | ORAL | Status: DC | PRN

## 2020-06-03 MED ORDER — MISOPROSTOL 200 MCG PO TABS
800.0000 ug | ORAL_TABLET | Freq: Once | ORAL | Status: AC
  Administered 2020-06-03: 08:00:00 800 ug via BUCCAL
  Filled 2020-06-03: qty 4

## 2020-06-03 MED ORDER — HYDROMORPHONE HCL 1 MG/ML IJ SOLN
1.0000 mg | INTRAMUSCULAR | Status: DC | PRN
  Administered 2020-06-03 (×2): 2 mg via INTRAVENOUS
  Filled 2020-06-03 (×2): qty 2

## 2020-06-03 MED ORDER — PROMETHAZINE HCL 25 MG/ML IJ SOLN
12.5000 mg | Freq: Once | INTRAMUSCULAR | Status: AC
Start: 2020-06-03 — End: 2020-06-03
  Administered 2020-06-03: 09:00:00 12.5 mg via INTRAVENOUS
  Filled 2020-06-03: qty 1

## 2020-06-03 MED ORDER — CALCIUM CARBONATE ANTACID 500 MG PO CHEW: 2.0000 | CHEWABLE_TABLET | ORAL | Status: DC | PRN

## 2020-06-03 MED ORDER — BENZOCAINE-MENTHOL 20-0.5 % EX AERO: 1.0000 "application " | INHALATION_SPRAY | CUTANEOUS | Status: DC | PRN

## 2020-06-03 MED ORDER — TETANUS-DIPHTH-ACELL PERTUSSIS 5-2.5-18.5 LF-MCG/0.5 IM SUSY: 0.5000 mL | PREFILLED_SYRINGE | Freq: Once | INTRAMUSCULAR | Status: DC

## 2020-06-03 MED ORDER — HYDROMORPHONE HCL 1 MG/ML IJ SOLN
0.5000 mg | Freq: Once | INTRAMUSCULAR | Status: AC
  Administered 2020-06-03: 0.5 mg via INTRAMUSCULAR
  Filled 2020-06-03: qty 1

## 2020-06-03 MED ORDER — IBUPROFEN 600 MG PO TABS
600.0000 mg | ORAL_TABLET | Freq: Four times a day (QID) | ORAL | Status: DC
  Administered 2020-06-03 – 2020-06-04 (×3): 600 mg via ORAL
  Filled 2020-06-03 (×3): qty 1

## 2020-06-03 MED ORDER — ACETAMINOPHEN 500 MG PO TABS
1000.0000 mg | ORAL_TABLET | Freq: Once | ORAL | Status: AC
  Administered 2020-06-03: 13:00:00 1000 mg via ORAL
  Filled 2020-06-03: qty 2

## 2020-06-03 MED ORDER — SODIUM CHLORIDE 0.9 % IV SOLN
3.0000 g | Freq: Once | INTRAVENOUS | Status: AC
  Administered 2020-06-03: 3 g via INTRAVENOUS
  Filled 2020-06-03: qty 8

## 2020-06-03 MED ORDER — OXYTOCIN-SODIUM CHLORIDE 30-0.9 UT/500ML-% IV SOLN
INTRAVENOUS | Status: AC
  Filled 2020-06-03: qty 500

## 2020-06-03 MED ORDER — ACETAMINOPHEN 325 MG PO TABS: 650.0000 mg | ORAL_TABLET | ORAL | Status: DC | PRN

## 2020-06-03 MED ORDER — LACTATED RINGERS IV SOLN: INTRAVENOUS | Status: DC

## 2020-06-03 MED ORDER — DIPHENHYDRAMINE HCL 25 MG PO CAPS: 25.0000 mg | ORAL_CAPSULE | Freq: Four times a day (QID) | ORAL | Status: DC | PRN

## 2020-06-03 MED ORDER — WITCH HAZEL-GLYCERIN EX PADS: 1.0000 "application " | MEDICATED_PAD | CUTANEOUS | Status: DC | PRN

## 2020-06-03 MED ORDER — DOCUSATE SODIUM 100 MG PO CAPS
100.0000 mg | ORAL_CAPSULE | Freq: Every day | ORAL | Status: DC
  Administered 2020-06-04: 09:00:00 100 mg via ORAL
  Filled 2020-06-03: qty 1

## 2020-06-03 MED ORDER — DIBUCAINE (PERIANAL) 1 % EX OINT: 1.0000 "application " | TOPICAL_OINTMENT | CUTANEOUS | Status: DC | PRN

## 2020-06-03 MED ORDER — OXYCODONE HCL 5 MG PO TABS: 10.0000 mg | ORAL_TABLET | ORAL | Status: DC | PRN

## 2020-06-03 MED ORDER — MISOPROSTOL 200 MCG PO TABS: 400.0000 ug | ORAL_TABLET | ORAL | Status: DC

## 2020-06-03 MED ORDER — MAGNESIUM HYDROXIDE 400 MG/5ML PO SUSP: 30.0000 mL | ORAL | Status: DC | PRN

## 2020-06-03 MED ORDER — COCONUT OIL OIL: 1.0000 "application " | TOPICAL_OIL | Status: DC | PRN

## 2020-06-03 MED ORDER — SIMETHICONE 80 MG PO CHEW: 80.0000 mg | CHEWABLE_TABLET | ORAL | Status: DC | PRN

## 2020-06-03 MED ORDER — PRENATAL MULTIVITAMIN CH
1.0000 | ORAL_TABLET | Freq: Every day | ORAL | Status: DC
  Administered 2020-06-04: 09:00:00 1 via ORAL
  Filled 2020-06-03: qty 1

## 2020-06-03 MED ORDER — OXYCODONE HCL 5 MG PO TABS: 5.0000 mg | ORAL_TABLET | ORAL | Status: DC | PRN

## 2020-06-03 NOTE — MAU Note (Signed)
Patient reports to triage with complaints of abdominal pain and vaginal bleeding. Patient reports the pain since 3am this morning.

## 2020-06-03 NOTE — Progress Notes (Signed)
Nonviable infant delivered at 1408, placenta delivered at 1411.

## 2020-06-03 NOTE — MAU Provider Note (Addendum)
Chief Complaint: Vaginal Bleeding and Abdominal Pain ° ° Event Date/Time  ° First Provider Initiated Contact with Patient 06/03/20 0629   °  °   °SUBJECTIVE °HPI: Kathleen Andersen is a 39 y.o. G2P1001 at [redacted]w[redacted]d by LMP who presents to maternity admissions reporting severe lower abdominal pain since 3am.  Was seen for pain and urinary symptoms several days ago.  US there showed a 10 week pregnancy.  Also has some spotting this morning. . °She denies vaginal itching/burning, urinary symptoms, h/a, dizziness, n/v, or fever/chills.   °Vaginal Bleeding °The patient's primary symptoms include pelvic pain and vaginal bleeding. The patient's pertinent negatives include no genital itching, genital lesions or genital odor. This is a new problem. The current episode started today. The problem occurs constantly. The problem has been unchanged. The problem affects both sides. She is pregnant. Associated symptoms include abdominal pain. Pertinent negatives include no chills, constipation, diarrhea, fever, headaches, nausea or vomiting. The vaginal discharge was bloody. The vaginal bleeding is spotting. She has not been passing clots. She has not been passing tissue. Nothing aggravates the symptoms. She has tried nothing for the symptoms.  °Abdominal Pain °This is a new problem. The current episode started today. The onset quality is sudden. The problem occurs constantly. The problem has been unchanged. The pain is located in the suprapubic region. The quality of the pain is cramping. The abdominal pain does not radiate. Pertinent negatives include no constipation, diarrhea, fever, headaches, nausea or vomiting. The pain is aggravated by palpation. The pain is relieved by nothing. She has tried nothing for the symptoms.  °  °RN Note: °Patient reports to triage with complaints of abdominal pain and vaginal bleeding. Patient reports the pain since 3am this morning.  ° °Past Medical History:  °Diagnosis Date  °• Medical history  non-contributory   ° °Past Surgical History:  °Procedure Laterality Date  °• NO PAST SURGERIES    ° °Social History  ° °Socioeconomic History  °• Marital status: Single  °  Spouse name: Not on file  °• Number of children: Not on file  °• Years of education: Not on file  °• Highest education level: Not on file  °Occupational History  °• Not on file  °Tobacco Use  °• Smoking status: Former Smoker  °  Types: Cigarettes  °• Smokeless tobacco: Not on file  °Substance and Sexual Activity  °• Alcohol use: Yes  °  Alcohol/week: 7.0 standard drinks  °  Types: 7 Glasses of wine per week  °• Drug use: No  °• Sexual activity: Yes  °  Birth control/protection: None  °Other Topics Concern  °• Not on file  °Social History Narrative  °• Not on file  ° °Social Determinants of Health  ° °Financial Resource Strain: Not on file  °Food Insecurity: Not on file  °Transportation Needs: Not on file  °Physical Activity: Not on file  °Stress: Not on file  °Social Connections: Not on file  °Intimate Partner Violence: Not on file  ° °No current facility-administered medications on file prior to encounter.  ° °Current Outpatient Medications on File Prior to Encounter  °Medication Sig Dispense Refill  °• naproxen sodium (ALEVE) 220 MG tablet Take 220 mg by mouth.    ° °Allergies  °Allergen Reactions  °• Metronidazole Nausea And Vomiting  ° ° °I have reviewed patient's Past Medical Hx, Surgical Hx, Family Hx, Social Hx, medications and allergies.  ° °ROS:  °Review of Systems  °Constitutional: Negative for chills and   fever.  °Gastrointestinal: Positive for abdominal pain. Negative for constipation, diarrhea, nausea and vomiting.  °Genitourinary: Positive for pelvic pain and vaginal bleeding.  °Neurological: Negative for headaches.  ° °Review of Systems  °Other systems negative ° ° °Physical Exam  °Physical Exam °Vitals:  ° 06/03/20 0626  °BP: 113/71  °Pulse: 98  °Resp: 19  °Temp: 98.3 °F (36.8 °C)  ° ° °Constitutional: Well-developed,  well-nourished female in no acute distress, but quite uncomfortable.  °Cardiovascular: normal rate °Respiratory: normal effort °GI: Abd soft, non-tender.  °MS: Extremities nontender, no edema, normal ROM °Neurologic: Alert and oriented x 4.  °GU: Neg CVAT. ° °PELVIC EXAM:  Purulent drainage from cervix °                          Cervix 3cm/thin/fetal breech at about 0 station °                           No fluid ° °LAB RESULTS °   °Results for orders placed or performed during the hospital encounter of 06/03/20 (from the past 24 hour(s))  °Type and screen     Status: None (Preliminary result)  ° Collection Time: 06/03/20  6:30 AM  °Result Value Ref Range  ° ABO/RH(D) PENDING   ° Antibody Screen PENDING   ° Sample Expiration    °  06/06/2020,2359 °Performed at [facility], [address] [place], [place] [address] °  °CBC     Status: Abnormal  ° Collection Time: 06/03/20  7:17 AM  °Result Value Ref Range  ° WBC 15.7 (H) 4.0 - 10.5 K/uL  ° RBC 4.11 3.87 - 5.11 MIL/uL  ° Hemoglobin 12.0 12.0 - 15.0 g/dL  ° HCT 34.0 (L) 36.0 - 46.0 %  ° MCV 82.7 80.0 - 100.0 fL  ° MCH 29.2 26.0 - 34.0 pg  ° MCHC 35.3 30.0 - 36.0 g/dL  ° RDW 13.9 11.5 - 15.5 %  ° Platelets 250 150 - 400 K/uL  ° nRBC 0.0 0.0 - 0.2 %  ° ° ° °IMAGING °Single IUP at [redacted]w[redacted]d °Anhydramnios °FHR 138 ° °MAU Management/MDM: °Medicated with Dilaudid for pain with some relief °Unasyn given after IV started °GC/Ch and wet  Prep sent °Dr Anyanwu her to see patient ° °ASSESSMENT °Single IUP at [redacted]w[redacted]d °PPROM x 9 days °Leukocytosis, pelvic infection/chorioamnionitis °Purulent discharge from uterus °Active labor ° °PLAN °Admit to [facility] per Dr Anyanwu °Cytotec augmentation due to Septic abortion in process ° Unasyn infusing °Discharge home on augmentin °MD to follow ° °Ginger Leeth CNM, MSN °Certified Nurse-Midwife °06/03/2020  °6:29 AM ° ° ° ° °

## 2020-06-03 NOTE — H&P (Signed)
Chief Complaint: Vaginal Bleeding and Abdominal Pain   Event Date/Time   First Provider Initiated Contact with Patient 06/03/20 [phone]        SUBJECTIVE HPI: Kathleen Andersen is a 39 y.o. G2P1001 at [redacted]w[redacted]d by LMP who presents to maternity admissions reporting severe lower abdominal pain since 3am.  Was seen for pain and urinary symptoms several days ago.  [place] there showed a 10 week pregnancy.  Also has some spotting this morning. . She denies vaginal itching/burning, urinary symptoms, h/a, dizziness, n/v, or fever/chills.   Vaginal Bleeding The patient's primary symptoms include pelvic pain and vaginal bleeding. The patient's pertinent negatives include no genital itching, genital lesions or genital odor. This is a new problem. The current episode started today. The problem occurs constantly. The problem has been unchanged. The problem affects both sides. She is pregnant. Associated symptoms include abdominal pain. Pertinent negatives include no chills, constipation, diarrhea, fever, headaches, nausea or vomiting. The vaginal discharge was bloody. The vaginal bleeding is spotting. She has not been passing clots. She has not been passing tissue. Nothing aggravates the symptoms. She has tried nothing for the symptoms.  Abdominal Pain This is a new problem. The current episode started today. The onset quality is sudden. The problem occurs constantly. The problem has been unchanged. The pain is located in the suprapubic region. The quality of the pain is cramping. The abdominal pain does not radiate. Pertinent negatives include no constipation, diarrhea, fever, headaches, nausea or vomiting. The pain is aggravated by palpation. The pain is relieved by nothing. She has tried nothing for the symptoms.    RN Note: Patient reports to triage with complaints of abdominal pain and vaginal bleeding. Patient reports the pain since 3am this morning.   Past Medical History:  Diagnosis Date   Medical history  non-contributory    Past Surgical History:  Procedure Laterality Date   NO PAST SURGERIES     Social History   Socioeconomic History   Marital status: Single    Spouse name: Not on file   Number of children: Not on file   Years of education: Not on file   Highest education level: Not on file  Occupational History   Not on file  Tobacco Use   Smoking status: Former Smoker    Types: Cigarettes   Smokeless tobacco: Not on file  Substance and Sexual Activity   Alcohol use: Yes    Alcohol/week: 7.0 standard drinks    Types: 7 Glasses of wine per week   Drug use: No   Sexual activity: Yes    Birth control/protection: None  Other Topics Concern   Not on file  Social History Narrative   Not on file   Social Determinants of Health   Financial Resource Strain: Not on file  Food Insecurity: Not on file  Transportation Needs: Not on file  Physical Activity: Not on file  Stress: Not on file  Social Connections: Not on file  Intimate Partner Violence: Not on file   No current facility-administered medications on file prior to encounter.   Current Outpatient Medications on File Prior to Encounter  Medication Sig Dispense Refill   naproxen sodium (ALEVE) 220 MG tablet Take 220 mg by mouth.     Allergies  Allergen Reactions   Metronidazole Nausea And Vomiting    I have reviewed patient's Past Medical Hx, Surgical Hx, Family Hx, Social Hx, medications and allergies.   ROS:  Review of Systems  Constitutional: Negative for chills and  fever.  Gastrointestinal: Positive for abdominal pain. Negative for constipation, diarrhea, nausea and vomiting.  Genitourinary: Positive for pelvic pain and vaginal bleeding.  Neurological: Negative for headaches.   Review of Systems  Other systems negative   Physical Exam  Physical Exam Vitals:   06/03/20 0626  BP: 113/71  Pulse: 98  Resp: 19  Temp: 98.3 F (36.8 C)    Constitutional: Well-developed,  well-nourished female in no acute distress, but quite uncomfortable.  Cardiovascular: normal rate Respiratory: normal effort GI: Abd soft, non-tender.  MS: Extremities nontender, no edema, normal ROM Neurologic: Alert and oriented x 4.  GU: Neg CVAT.  PELVIC EXAM:  Purulent drainage from cervix                           Cervix 3cm/thin/fetal breech at about 0 station                            No fluid  LAB RESULTS    Results for orders placed or performed during the hospital encounter of 06/03/20 (from the past 24 hour(s))  Type and screen     Status: None (Preliminary result)   Collection Time: 06/03/20  6:30 AM  Result Value Ref Range   ABO/RH(D) PENDING    Antibody Screen PENDING    Sample Expiration      06/06/2020,2359 Performed at [facility], [address] [place], [place] [address]   CBC     Status: Abnormal   Collection Time: 06/03/20  7:17 AM  Result Value Ref Range   WBC 15.7 (H) 4.0 - 10.5 K/uL   RBC 4.11 3.87 - 5.11 MIL/uL   Hemoglobin 12.0 12.0 - 15.0 g/dL   HCT 06.2 (L) 37.6 - 28.3 %   MCV 82.7 80.0 - 100.0 fL   MCH 29.2 26.0 - 34.0 pg   MCHC 35.3 30.0 - 36.0 g/dL   RDW 15.1 76.1 - 60.7 %   Platelets 250 150 - 400 K/uL   nRBC 0.0 0.0 - 0.2 %     IMAGING Single IUP at [redacted]w[redacted]d Anhydramnios FHR 138  MAU Management/MDM: Medicated with Dilaudid for pain with some relief Unasyn given after IV started GC/Ch and wet  Prep sent Dr Macon Large her to see patient  ASSESSMENT Single IUP at [redacted]w[redacted]d PPROM x 9 days Leukocytosis, pelvic infection/chorioamnionitis Purulent discharge from uterus Active labor  PLAN Admit to [facility] per Dr Macon Large Cytotec augmentation due to Septic abortion in process  Unasyn infusing Discharge home on augmentin MD to follow  Wynelle Bourgeois CNM, MSN Certified Nurse-Midwife 06/03/2020  6:29 AM

## 2020-06-04 DIAGNOSIS — O039 Complete or unspecified spontaneous abortion without complication: Secondary | ICD-10-CM | POA: Diagnosis not present

## 2020-06-04 LAB — CBC
HCT: 28.7 % — ABNORMAL LOW (ref 36.0–46.0)
Hemoglobin: 10.2 g/dL — ABNORMAL LOW (ref 12.0–15.0)
MCH: 29.7 pg (ref 26.0–34.0)
MCHC: 35.5 g/dL (ref 30.0–36.0)
MCV: 83.4 fL (ref 80.0–100.0)
Platelets: 221 10*3/uL (ref 150–400)
RBC: 3.44 MIL/uL — ABNORMAL LOW (ref 3.87–5.11)
RDW: 14.1 % (ref 11.5–15.5)
WBC: 17.4 10*3/uL — ABNORMAL HIGH (ref 4.0–10.5)
nRBC: 0 % (ref 0.0–0.2)

## 2020-06-04 MED ORDER — AMOXICILLIN-POT CLAVULANATE 875-125 MG PO TABS: 1.0000 | ORAL_TABLET | Freq: Two times a day (BID) | ORAL | 1 refills | Status: AC

## 2020-06-04 MED ORDER — IBUPROFEN 600 MG PO TABS: 600.0000 mg | ORAL_TABLET | Freq: Four times a day (QID) | ORAL | 0 refills | Status: AC

## 2020-06-04 NOTE — Discharge Summary (Signed)
Physician Discharge Summary  Patient ID: Kathleen Andersen MRN: [id number] DOB/AGE: 38-26-1982 38 y.o.  Admit date: 06/03/2020 Discharge date: 06/04/2020  Admission Diagnoses:18 week septic abortion  Discharge Diagnoses:  Active Problems:   Preterm premature rupture of membranes (PPROM) with unknown onset of labor   Intrauterine infection   [redacted] weeks gestation of pregnancy   Inevitable abortion   Discharged Condition: good  Hospital Course:  KAMARAH BILOTTA is a 39 y.o. G2P1001 at [redacted]w[redacted]d by LMP who presents to maternity admissions reporting severe lower abdominal pain since 3am.  Was seen for pain and urinary symptoms several days ago.  [place] there showed a 10 week pregnancy.  Also has some spotting this morning. . She denies vaginal itching/burning, urinary symptoms, h/a, dizziness, n/v, or fever/chills.   Vaginal Bleeding The patient's primary symptoms include pelvic pain and vaginal bleeding. The patient's pertinent negatives include no genital itching, genital lesions or genital odor. This is a new problem. The current episode started today. The problem occurs constantly. The problem has been unchanged. The problem affects both sides. She is pregnant. Associated symptoms include abdominal pain. Pertinent negatives include no chills, constipation, diarrhea, fever, headaches, nausea or vomiting. The vaginal discharge was bloody. The vaginal bleeding is spotting. She has not been passing clots. She has not been passing tissue. Nothing aggravates the symptoms. She has tried nothing for the symptoms.  Ultrasound showed 18 week fetus with FHR but anhydramnios, and evidence of infection. She was admitted for induction with cytotec. She was delivered at 1400 on 12/17. She had fever to 103 which resolved after delivery. She received Unasyn IV during admission and was discharged with no fever on Augmentin for 6 days  Consults: None  Significant Diagnostic Studies: radiology: Ultrasound:    ----------------------------------------------------------------------  OBSTETRICS REPORT                       (Signed Final 06/03/2020 01:38 pm) ---------------------------------------------------------------------- Patient Info  ID #:       [id number]                          D.O.B.:  [birth year] (39 yrs)  Name:       Kathleen Andersen                Visit Date: 06/03/2020 07:02 am ---------------------------------------------------------------------- Performed By  Attending:        Ma Rings MD         Ref. Address:     [address]                                                             [place]                                                             [place]  [id number]  Performed By:     Marcellina Millin          Secondary Phy.:   [facility] MAU/Triage                    RDMS  Referred By:      Elby Showers                [facility] ---------------------------------------------------------------------- Orders  #  Description                           Code        Ordered By  1  [place] MFM OB LIMITED                     [phone]    Wynelle Bourgeois ----------------------------------------------------------------------  #  Order #                     Accession #                Episode #  1  [id number]                    [id number]                 [id number] ---------------------------------------------------------------------- Indications  Pelvic pain affecting pregnancy in second      O26.892  trimester  Encounter for uncertain dates                  Z36.87  Weeks of gestation of pregnancy not            Z3A.00  specified ---------------------------------------------------------------------- Fetal Evaluation  Num Of Fetuses:         1  Fetal Heart Rate(bpm):  137  Cardiac Activity:       Observed  Presentation:           Breech  Placenta:                Posterior  P. Cord Insertion:      Not well visualized  Amniotic Fluid  AFI FV:      Anhydramnios ---------------------------------------------------------------------- Biometry  BPD:        38  mm     G. Age:  17w 4d                  CI:        61.73   %  HC:      156.3  mm     G. Age:  18w 4d ---------------------------------------------------------------------- OB History  Gravidity:    2         Term:   1        Prem:   0        SAB:   0  TOP:          0       Ectopic:  0        Living: 1 ---------------------------------------------------------------------- Gestational Age  LMP:  14w 4d        Date:  02/22/20                 EDD:   11/28/20  U/S Today:     18w 1d                                        EDD:   11/03/20 ---------------------------------------------------------------------- Cervix Uterus Adnexa  Cervix  Not visualized. ---------------------------------------------------------------------- Comments  This patient presented to the MAU due to lower abdominal  pain.  A limited ultrasound performed today shows that the fetus is  in the breech presentation.  Anhydramnios is noted.  The fetal  parts appear to be in the vagina.  As the patient most likely ruptured membranes a few days  ago and possibly may be developing an intrauterine infection,  she will be admitted for induction. ----------------------------------------------------------------------                   Ma Rings, MD Electronically Signed Final Report   06/03/2020 01:38 pm ----------------------------------------------------------------------  Treatments: IV hydration and antibiotics: Unasyn  Discharge Exam: Blood pressure 99/65, pulse 79, temperature 98.1 F (36.7 C), temperature source Oral, resp. rate 18, height 5\' 4"  (1.626 m), weight 79.4 kg, last menstrual period 02/22/2020, SpO2 100 %. General appearance: alert, cooperative and no distress Resp: normal effort GI: soft,  non-tender; bowel sounds normal; no masses,  no organomegaly Extremities: extremities normal, atraumatic, no cyanosis or edema  Disposition: Discharge disposition: 01-Home or Self Care       Discharge Instructions    Discharge patient   Complete by: As directed    Discharge disposition: 01-Home or Self Care   Discharge patient date: 06/04/2020     Allergies as of 06/04/2020      Reactions   Metronidazole Nausea And Vomiting      Medication List    TAKE these medications   amoxicillin-clavulanate 875-125 MG tablet Commonly known as: AUGMENTIN Take 1 tablet by mouth 2 (two) times daily.   ibuprofen 600 MG tablet Commonly known as: ADVIL Take 1 tablet (600 mg total) by mouth every 6 (six) hours.   naproxen sodium 220 MG tablet Commonly known as: ALEVE Take 220 mg by mouth.       Follow-up Information    [facility] Follow up in 3 week(s).   Specialty: Obstetrics and Gynecology Contact information: [address] [place] [phone]              Signed: [phone] 06/04/2020, 7:42 AM

## 2020-06-04 NOTE — Discharge Instructions (Signed)
Miscarriage A miscarriage is the loss of an unborn baby (fetus) before the 20th week of pregnancy. Follow these instructions at home: Medicines   Take over-the-counter and prescription medicines only as told by your doctor.  If you were prescribed antibiotic medicine, take it as told by your doctor. Do not stop taking the antibiotic even if you start to feel better.  Do not take NSAIDs unless your doctor says that this is safe for you. NSAIDs include aspirin and ibuprofen. These medicines can cause bleeding. Activity  Rest as directed. Ask your doctor what activities are safe for you.  Have someone help you at home during this time. General instructions  Write down how many pads you use each day and how soaked they are.  Watch the amount of tissue or clumps of blood (blood clots) that you pass from your vagina. Save any large amounts of tissue for your doctor.  Do not use tampons, douche, or have sex until your doctor approves.  To help you and your partner with the process of grieving, talk with your doctor or seek counseling.  When you are ready, meet with your doctor to talk about steps you should take for your health. Also, talk with your doctor about steps to take to have a healthy pregnancy in the future.  Keep all follow-up visits as told by your doctor. This is important. Contact a doctor if:  You have a fever or chills.  You have vaginal discharge that smells bad.  You have more bleeding. Get help right away if:  You have very bad cramps or pain in your back or belly.  You pass clumps of blood that are walnut-sized or larger from your vagina.  You pass tissue that is walnut-sized or larger from your vagina.  You soak more than 1 regular pad in an hour.  You get light-headed or weak.  You faint (pass out).  You have feelings of sadness that do not go away, or you have thoughts of hurting yourself. Summary  A miscarriage is the loss of an unborn baby before  the 20th week of pregnancy.  Follow your doctor's instructions for home care. Keep all follow-up appointments.  To help you and your partner with the process of grieving, talk with your doctor or seek counseling. This information is not intended to replace advice given to you by your health care provider. Make sure you discuss any questions you have with your health care provider. Document Revised: 09/26/2018 Document Reviewed: 07/10/2016 Elsevier Patient Education  2020 [employer]  

## 2020-06-06 LAB — GC/CHLAMYDIA PROBE AMP (~~LOC~~) NOT AT ARMC
Chlamydia: NEGATIVE
Comment: NEGATIVE
Comment: NORMAL
Neisseria Gonorrhea: NEGATIVE

## 2020-06-07 LAB — SURGICAL PATHOLOGY

## 2022-04-22 IMAGING — US US OB TRANSVAGINAL
1 series · 7 of 7 positions shown · non-contrast
Comparison: none

[Series 1: us ob transvaginal · 7 of 7 slices shown]
[im 1/7]
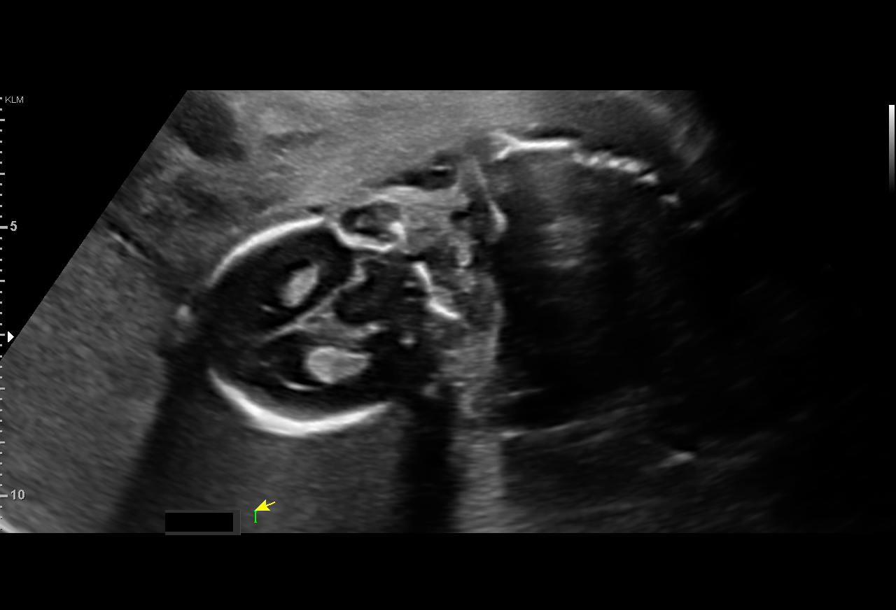
[im 2/7]
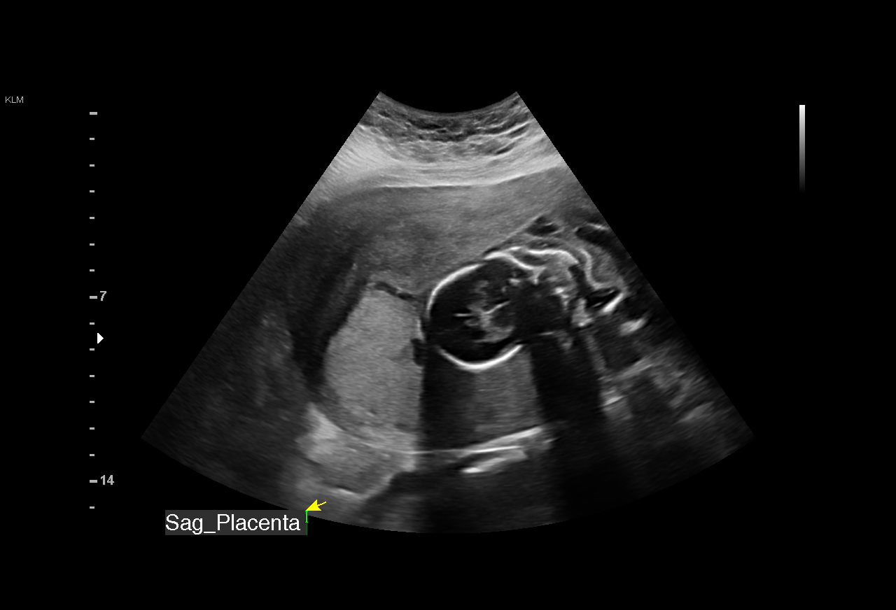
[im 3/7]
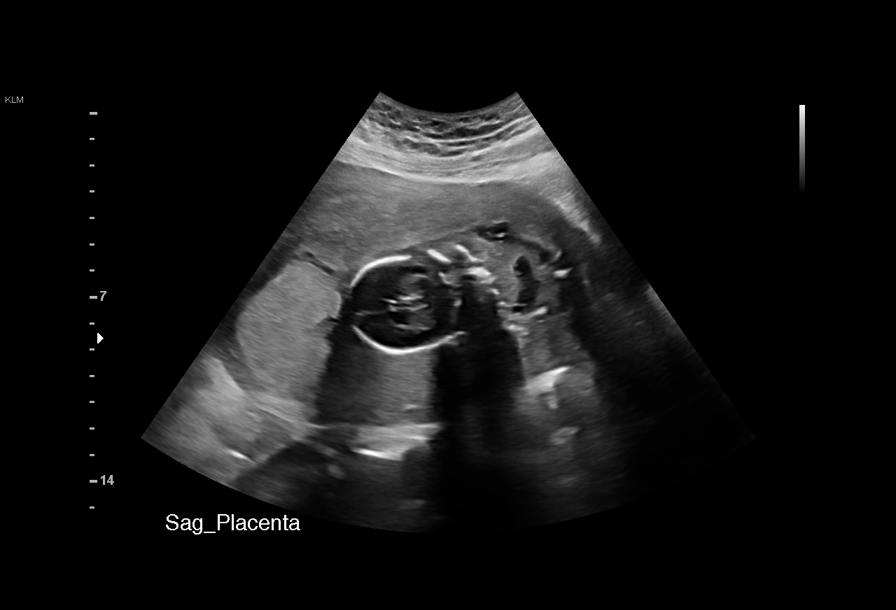
[im 4/7]
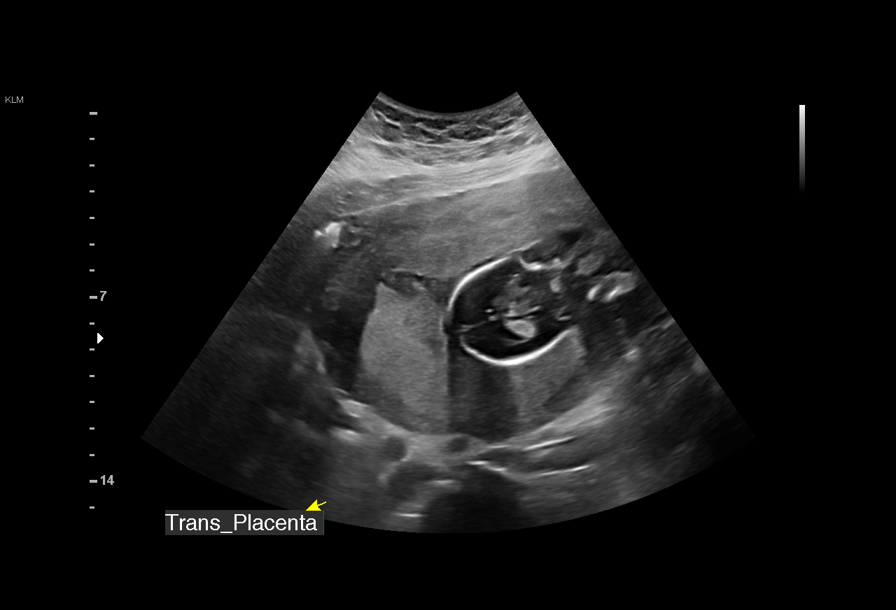
[im 5/7]
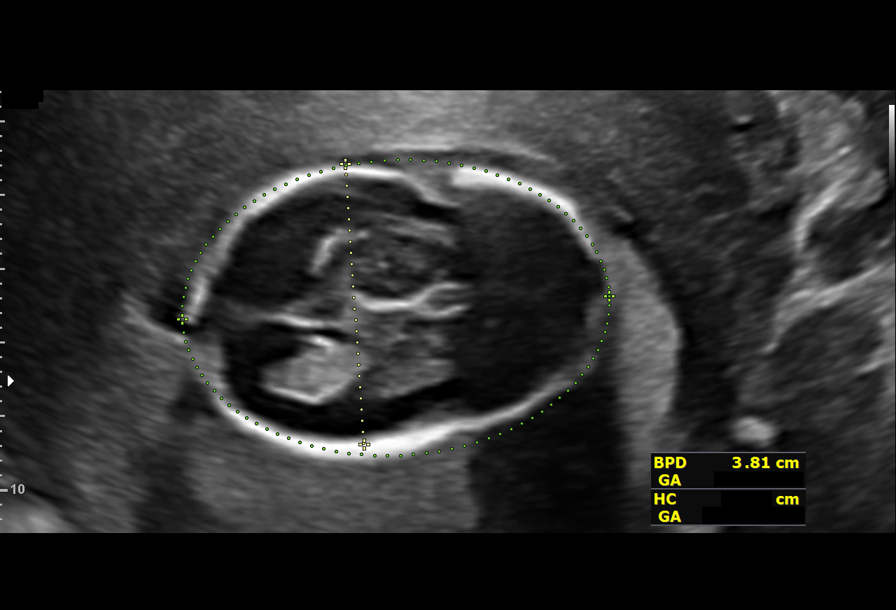
[im 6/7]
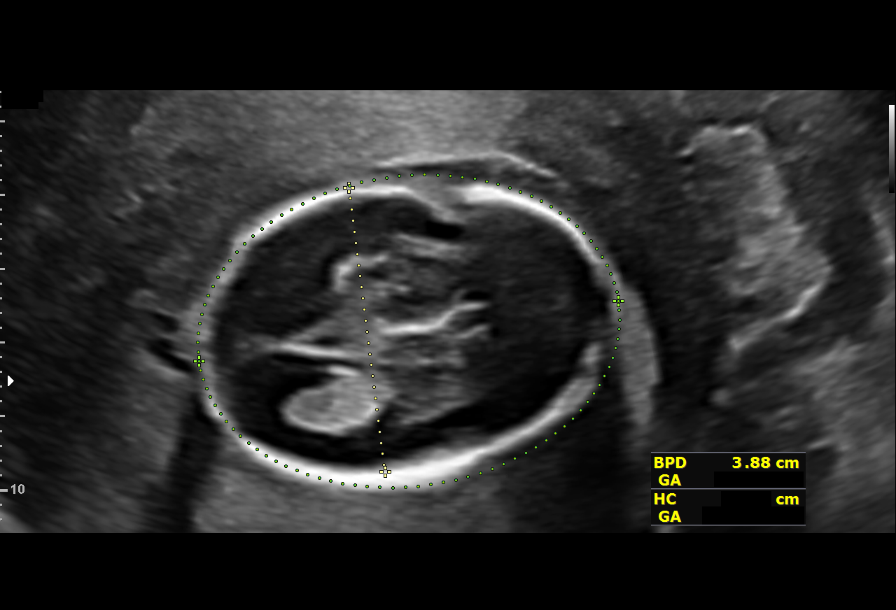
[im 7/7]
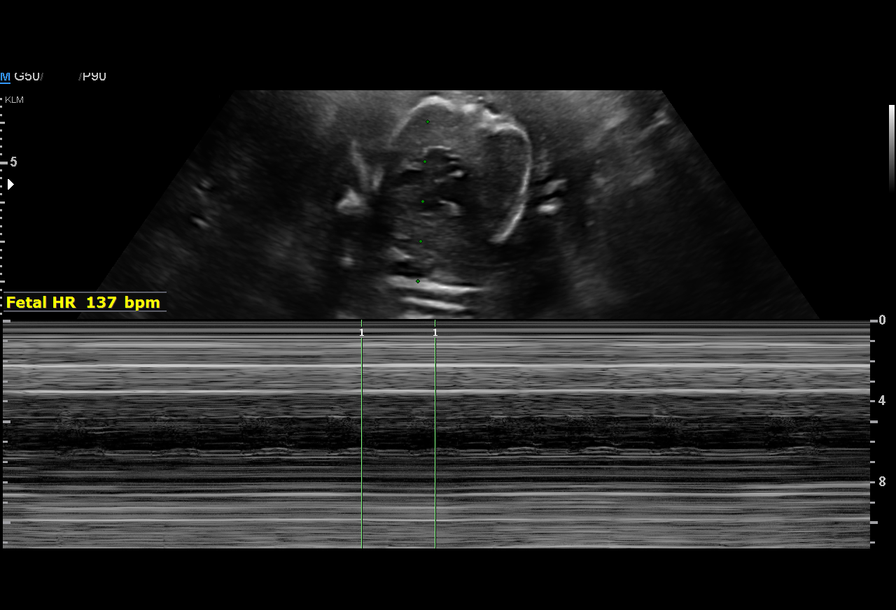

[7 of 7 positions shown; findings below may reference images not displayed]

[REDACTED]
                   [facility] CNM                             [HOSPITAL]

Indications

 Pelvic pain affecting pregnancy in second
 trimester
 Encounter for uncertain dates
 Weeks of gestation of pregnancy not
 specified
Fetal Evaluation

 Num Of Fetuses:         1
 Fetal Heart Rate(bpm):  137
 Cardiac Activity:       Observed
 Presentation:           Breech
 Placenta:               Posterior
 P. Cord Insertion:      Not well visualized

 Amniotic Fluid
 AFI FV:      Anhydramnios
Biometry

 BPD:        38  mm     G. Age:  17w 4d                  CI:        61.73   %
 HC:      156.3  mm     G. Age:  18w 4d
OB History

 Gravidity:    2         Term:   1        Prem:   0        SAB:   0
 TOP:          0       Ectopic:  0        Living: 1
Gestational Age
 LMP:           14w 4d        Date:  02/22/20                 EDD:   11/28/20
 U/S Today:     18w 1d                                        EDD:   11/03/20
Cervix Uterus Adnexa

 Cervix
 Not visualized.
Comments

 This patient presented to the [facility] due to lower abdominal
 pain.

 A limited ultrasound performed today shows that the fetus is
 in the breech presentation.  Anhydramnios is noted.  The fetal
 parts appear to be in the vagina.

 As the patient most likely ruptured membranes a few days
 ago and possibly may be developing an intrauterine infection,
 she will be admitted for induction.

## 2024-05-11 ENCOUNTER — Other Ambulatory Visit: Payer: Self-pay

## 2024-05-11 ENCOUNTER — Emergency Department (HOSPITAL_BASED_OUTPATIENT_CLINIC_OR_DEPARTMENT_OTHER)
Admission: EM | Admit: 2024-05-11 | Discharge: 2024-05-11 | Disposition: A | Discharge status: Home / Self Care | Payer: Self-pay | Attending: Emergency Medicine | Admitting: Emergency Medicine

## 2024-05-11 ENCOUNTER — Encounter (HOSPITAL_BASED_OUTPATIENT_CLINIC_OR_DEPARTMENT_OTHER): Payer: Self-pay

## 2024-05-11 DIAGNOSIS — L03011 Cellulitis of right finger: Secondary | ICD-10-CM | POA: Insufficient documentation

## 2024-05-11 MED ORDER — LIDOCAINE HCL (PF) 1 % IJ SOLN
5.0000 mL | Freq: Once | INTRAMUSCULAR | Status: AC
  Administered 2024-05-11: 5 mL

## 2024-05-11 MED ORDER — LIDOCAINE HCL 1 % IJ SOLN
INTRAMUSCULAR | Status: AC
  Administered 2024-05-11: 20 mL
  Filled 2024-05-11: qty 20

## 2024-05-11 NOTE — ED Provider Notes (Signed)
 [place] EMERGENCY DEPARTMENT AT [facility] Provider Note   CSN: [id number] Arrival date & time: 05/11/24  9452     Patient presents with: No chief complaint on file.   Kathleen Andersen is a 43 y.o. female.   HPI     This is a 43 year old female who presents with concern for swollen painful finger.  Patient reports 5-day history of swelling and tenderness of the right second digit.  She states that she felt like it was getting infected.  During this time she did have a feeling of her nails but denies any manipulation of the cuticle.  She was seen in urgent care on Friday and given doxycycline but symptoms have persisted and worsened.  Denies fevers or systemic symptoms.  Prior to Admission medications   Medication Sig Start Date End Date Taking? Authorizing Provider  amoxicillin -clavulanate (AUGMENTIN ) 875-125 MG tablet Take 1 tablet by mouth 2 (two) times daily. 06/04/20   Eveline Lynwood MATSU, MD  ibuprofen  (ADVIL ) 600 MG tablet Take 1 tablet (600 mg total) by mouth every 6 (six) hours. 06/04/20   Eveline Lynwood MATSU, MD  naproxen sodium (ALEVE) 220 MG tablet Take 220 mg by mouth.    [provider]    Allergies: Metronidazole     Review of Systems  Constitutional:  Negative for fever.  Musculoskeletal:        Finger pain  All other systems reviewed and are negative.   Updated Vital Signs BP (!) 144/99   Pulse 78   Temp 98.4 F (36.9 C) (Oral)   Resp 18   LMP 04/12/2024 (Exact Date)   SpO2 100%   Physical Exam Vitals and nursing note reviewed.  Constitutional:      Appearance: She is well-developed. She is not ill-appearing.  HENT:     Head: Normocephalic and atraumatic.  Eyes:     Pupils: Pupils are equal, round, and reactive to light.  Cardiovascular:     Rate and Rhythm: Normal rate and regular rhythm.  Pulmonary:     Effort: Pulmonary effort is normal. No respiratory distress.  Musculoskeletal:     Cervical back: Neck supple.     Comments:  Focused examination of the right second digit with fluctuance and tenderness to palpation along the lateral cuticle and nail, no adjacent erythema  Skin:    General: Skin is warm and dry.  Neurological:     Mental Status: She is alert and oriented to person, place, and time.     (all labs ordered are listed, but only abnormal results are displayed) Labs Reviewed - No data to display  EKG: None  Radiology: No results found.   .Incision and Drainage  Date/Time: 05/11/2024 6:27 AM  Performed by: Bari Charmaine FALCON, MD Authorized by: Bari Charmaine FALCON, MD   Consent:    Consent obtained:  Verbal   Consent given by:  Patient   Risks discussed:  Bleeding, incomplete drainage, infection and damage to other organs Location:    Type:  Abscess   Location:  Upper extremity   Upper extremity location:  Finger   Finger location:  R index finger Pre-procedure details:    Skin preparation:  Povidone-iodine Sedation:    Sedation type:  None Anesthesia:    Anesthesia method:  Local infiltration   Local anesthetic:  Lidocaine  1% w/o epi Procedure type:    Complexity:  Simple Procedure details:    Needle aspiration: no     Incision types:  Stab incision   Incision  depth:  Dermal   Drainage:  Purulent   Drainage amount:  Moderate   Packing materials:  None Post-procedure details:    Procedure completion:  Tolerated    Medications Ordered in the ED  lidocaine  (PF) (XYLOCAINE ) 1 % injection 5 mL (5 mLs Other Given 05/11/24 0617)  lidocaine  (XYLOCAINE ) 1 % (with pres) injection (20 mLs  Given 05/11/24 9386)                                    Medical Decision Making Risk Prescription drug management.   This patient presents to the ED for concern of finger pain, this involves an extensive number of treatment options, and is a complaint that carries with it a high risk of complications and morbidity.  I considered the following differential and admission for this acute,  potentially life threatening condition.  The differential diagnosis includes infection, injury, paronychia, felon  MDM:    This is a 43 year old female who presents with painful right second digit.  Clinical exam consistent with paronychia.  This was drained at the bedside with moderate amount of purulence expressed.  Patient currently on doxycycline from urgent care.  She can discontinue use of this.  Recommend that she continue warm soaks at home.  (Labs, imaging, consults)  Labs: I Ordered, and personally interpreted labs.  The pertinent results include: N/A  Imaging Studies ordered: I ordered imaging studies including N/A I independently visualized and interpreted imaging. I agree with the radiologist interpretation  Additional history obtained from chart review.  External records from outside source obtained and reviewed including prior evaluations  Cardiac Monitoring: The patient was not maintained on a cardiac monitor.  If on the cardiac monitor, I personally viewed and interpreted the cardiac monitored which showed an underlying rhythm of: N/A  Reevaluation: After the interventions noted above, I reevaluated the patient and found that they have :improved  Social Determinants of Health: lives independently  Disposition:  d/c  Co morbidities that complicate the patient evaluation  Past Medical History:  Diagnosis Date   Medical history non-contributory      Medicines Meds ordered this encounter  Medications   lidocaine  (PF) (XYLOCAINE ) 1 % injection 5 mL   lidocaine  (XYLOCAINE ) 1 % (with pres) injection    [name], [name]: cabinet override    I have reviewed the patients home medicines and have made adjustments as needed  Problem List / ED Course: Problem List Items Addressed This Visit   None Visit Diagnoses       Paronychia of finger of right hand    -  Primary                Final diagnoses:  Paronychia of finger of right hand    ED  Discharge Orders     None          Bari Charmaine FALCON, MD 05/11/24 [phone]

## 2024-05-11 NOTE — ED Triage Notes (Signed)
 Arrives POV. 1ST finger of R hand swollen and painful x1 week. Taking Doxycycline x2 days. Denies fevers. Finger swelling observed.

## 2024-05-11 NOTE — Discharge Instructions (Signed)
 Continue warm soaks to facilitate ongoing drainage.  Tylenol  or ibuprofen  as needed for pain.  Monitor for signs and symptoms of redness.  If you notice redness you may reinitiate doxycycline.
